# Patient Record
Sex: Male | Born: 1960 | Race: White | Hispanic: No | Marital: Single
Health system: Southern US, Community
[De-identification: ages and names within clinical notes are randomized; demographics above are authoritative.]

---

## 2012-07-06 ENCOUNTER — Emergency Department: Payer: Self-pay | Admitting: Emergency Medicine

## 2012-07-09 ENCOUNTER — Emergency Department: Payer: Self-pay | Admitting: Emergency Medicine

## 2013-01-19 ENCOUNTER — Emergency Department: Payer: Self-pay | Admitting: Emergency Medicine

## 2013-06-18 IMAGING — CR DG SHOULDER 3+V*R*
1 series · 3 of 3 positions shown · non-contrast
Comparison: none

REASON FOR EXAM: swelling , pain, numbness post reduction
COMMENTS:

[Series 1: internal rotate · 0.17mm/px · 3 of 3 slices shown]
[im 1/3]
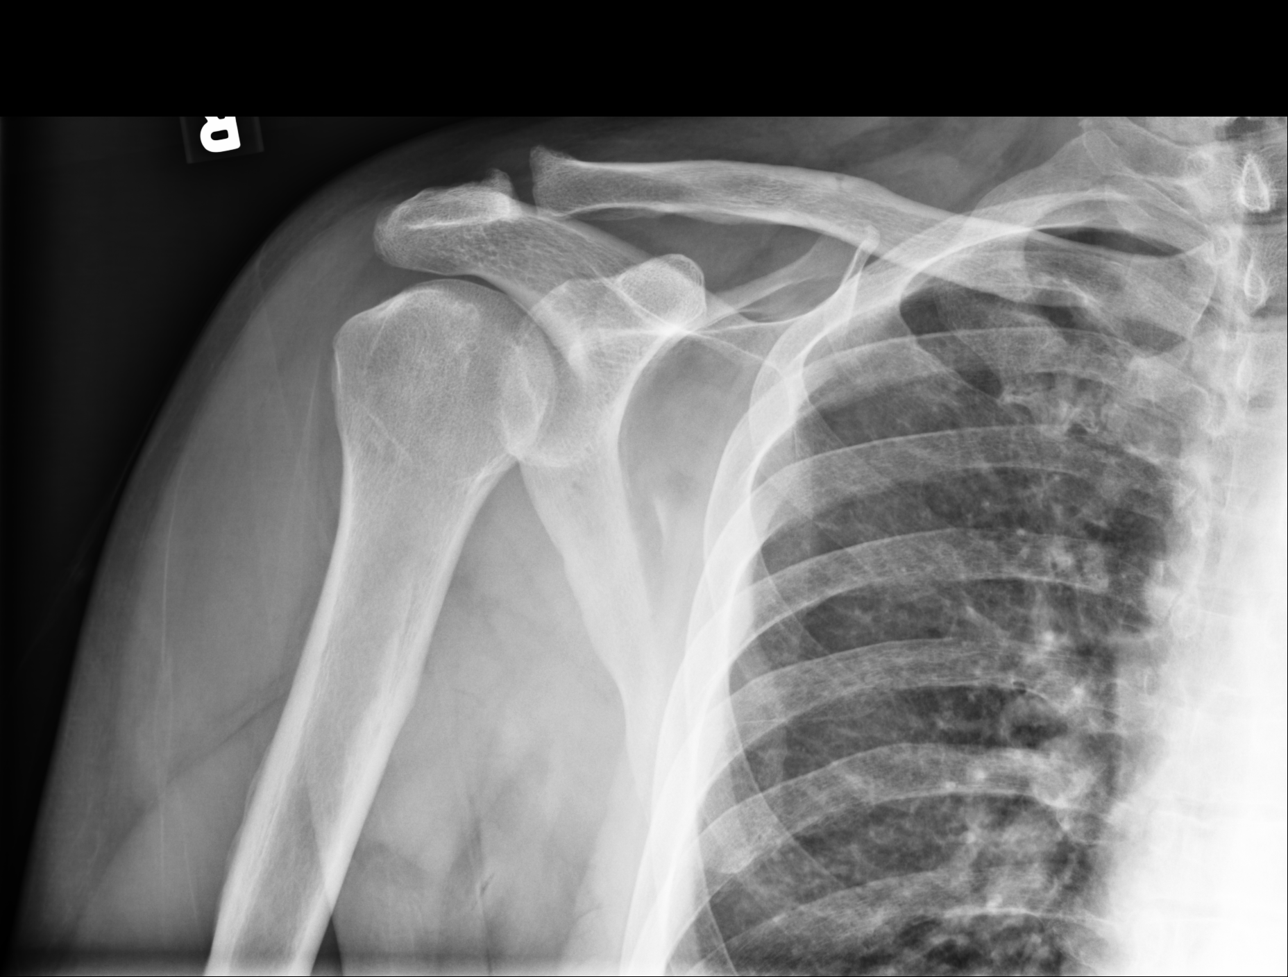
[im 2/3]
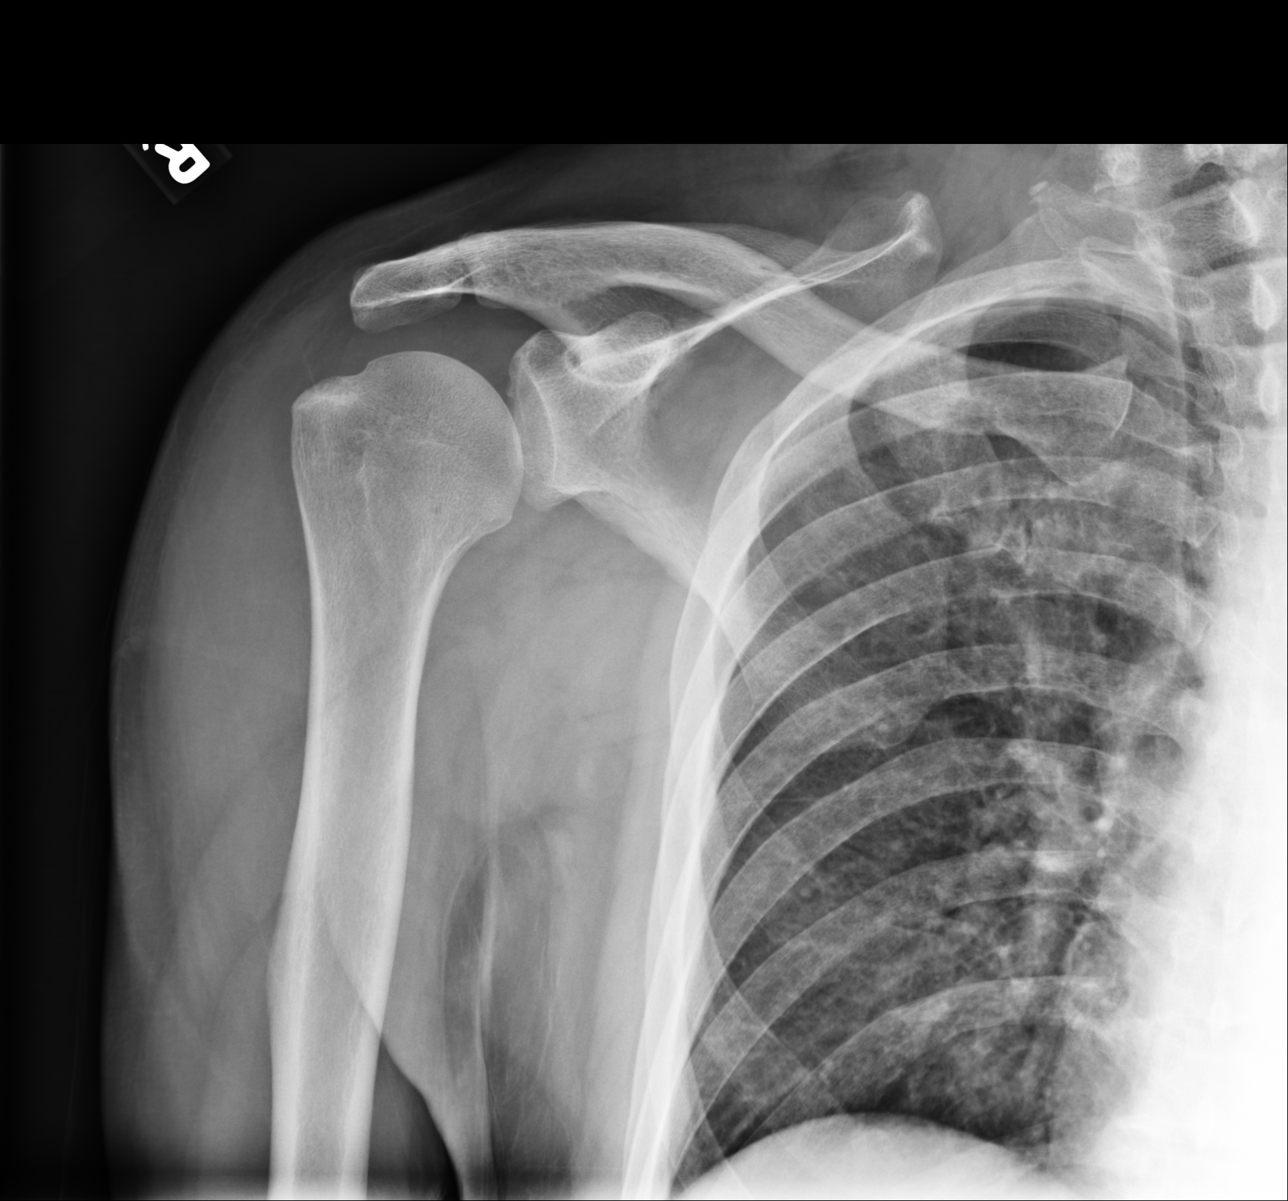
[im 3/3]
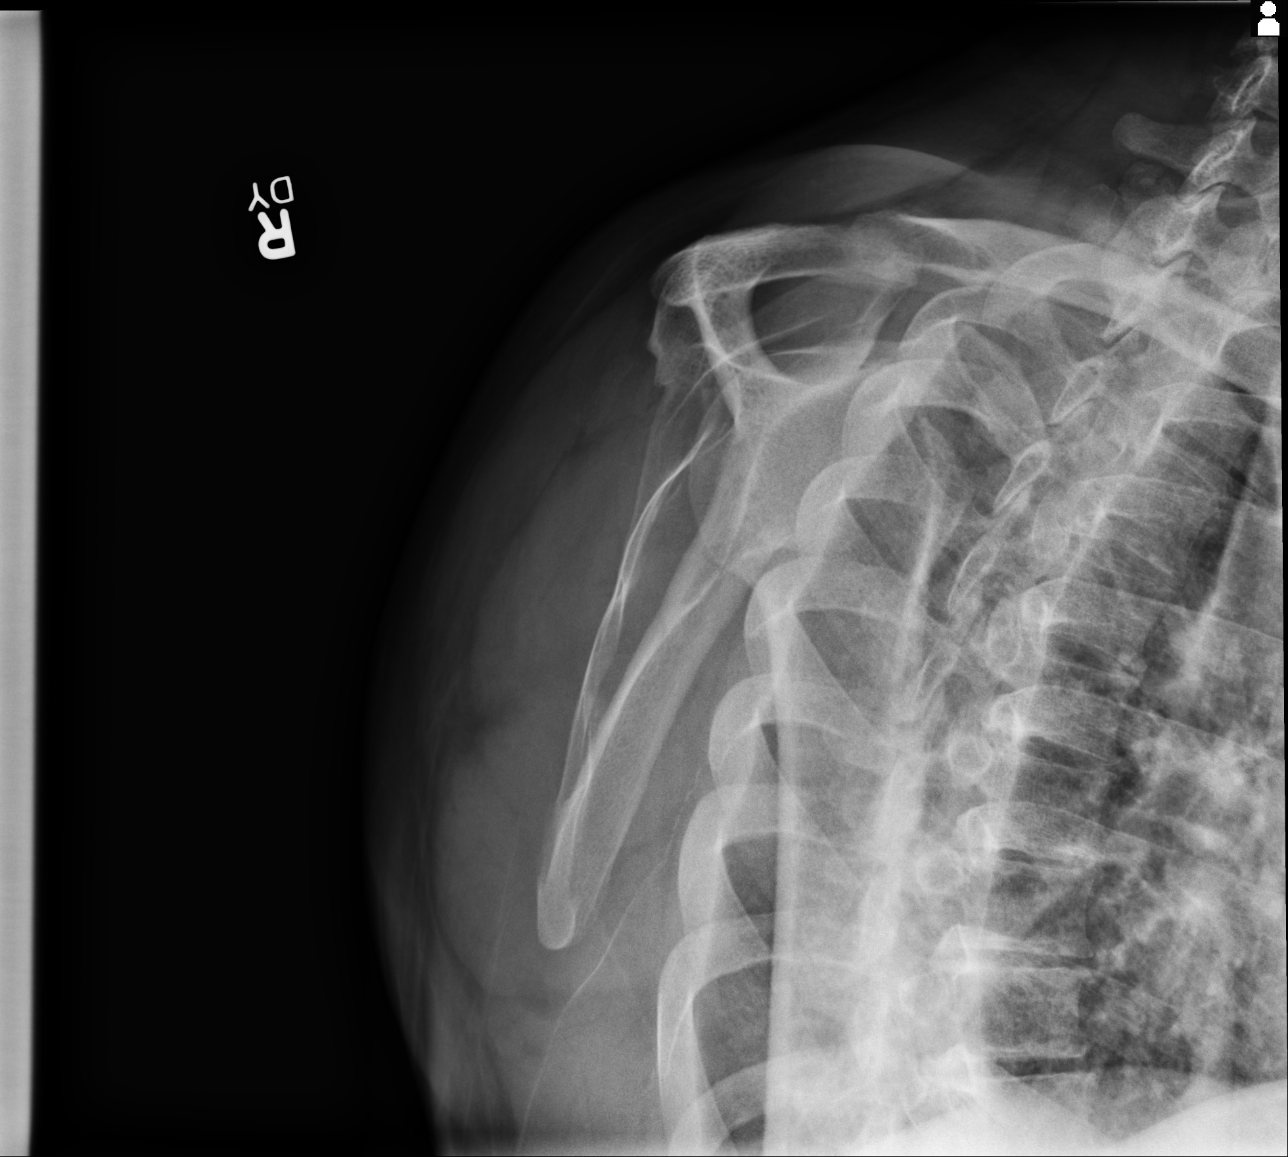

[3 of 3 positions shown; findings below may reference images not displayed]

PROCEDURE:     DXR - DXR SHOULDER RIGHT COMPLETE  - July 09, 2012  [DATE]

RESULT:     Comparison is made to a study July 06, 2012.

The patient has sustained a previous dislocation which appear to be reduced
on postreduction films 06 July, 2012. On today's study the humerus
appears intact as does the bony glenoid. The AC joint appears normally
positioned but there are degenerative changes present. The clavicle is
intact where visualized.
IMPRESSION: I do not see definite evidence of an acute fracture of the
right shoulder and no residual dislocation is demonstrated. However, given
the patient's persistent symptoms, followup MRI is recommended.

[REDACTED]

## 2019-01-02 DIAGNOSIS — M19011 Primary osteoarthritis, right shoulder: Secondary | ICD-10-CM | POA: Diagnosis not present

## 2019-01-02 DIAGNOSIS — M19012 Primary osteoarthritis, left shoulder: Secondary | ICD-10-CM | POA: Diagnosis not present

## 2019-03-26 DIAGNOSIS — Z23 Encounter for immunization: Secondary | ICD-10-CM | POA: Diagnosis not present

## 2019-04-05 DIAGNOSIS — M19019 Primary osteoarthritis, unspecified shoulder: Secondary | ICD-10-CM | POA: Diagnosis not present

## 2019-07-12 DIAGNOSIS — M19019 Primary osteoarthritis, unspecified shoulder: Secondary | ICD-10-CM | POA: Diagnosis not present

## 2019-10-15 DIAGNOSIS — M19011 Primary osteoarthritis, right shoulder: Secondary | ICD-10-CM | POA: Diagnosis not present

## 2019-10-15 DIAGNOSIS — M19012 Primary osteoarthritis, left shoulder: Secondary | ICD-10-CM | POA: Diagnosis not present

## 2019-11-02 DIAGNOSIS — Z23 Encounter for immunization: Secondary | ICD-10-CM | POA: Diagnosis not present

## 2019-11-05 DIAGNOSIS — K219 Gastro-esophageal reflux disease without esophagitis: Secondary | ICD-10-CM | POA: Diagnosis not present

## 2019-11-05 DIAGNOSIS — I10 Essential (primary) hypertension: Secondary | ICD-10-CM | POA: Diagnosis not present

## 2019-11-05 DIAGNOSIS — F418 Other specified anxiety disorders: Secondary | ICD-10-CM | POA: Diagnosis not present

## 2020-01-03 DIAGNOSIS — Z419 Encounter for procedure for purposes other than remedying health state, unspecified: Secondary | ICD-10-CM | POA: Diagnosis not present

## 2020-01-15 DIAGNOSIS — M19012 Primary osteoarthritis, left shoulder: Secondary | ICD-10-CM | POA: Diagnosis not present

## 2020-01-15 DIAGNOSIS — M19011 Primary osteoarthritis, right shoulder: Secondary | ICD-10-CM | POA: Diagnosis not present

## 2020-02-03 DIAGNOSIS — Z419 Encounter for procedure for purposes other than remedying health state, unspecified: Secondary | ICD-10-CM | POA: Diagnosis not present

## 2020-02-14 DIAGNOSIS — M19012 Primary osteoarthritis, left shoulder: Secondary | ICD-10-CM | POA: Diagnosis not present

## 2020-02-14 DIAGNOSIS — M19011 Primary osteoarthritis, right shoulder: Secondary | ICD-10-CM | POA: Diagnosis not present

## 2020-03-04 DIAGNOSIS — M659 Synovitis and tenosynovitis, unspecified: Secondary | ICD-10-CM | POA: Diagnosis not present

## 2020-03-04 DIAGNOSIS — M19011 Primary osteoarthritis, right shoulder: Secondary | ICD-10-CM | POA: Diagnosis not present

## 2020-03-04 DIAGNOSIS — M19012 Primary osteoarthritis, left shoulder: Secondary | ICD-10-CM | POA: Diagnosis not present

## 2020-03-04 DIAGNOSIS — M75122 Complete rotator cuff tear or rupture of left shoulder, not specified as traumatic: Secondary | ICD-10-CM | POA: Diagnosis not present

## 2020-03-05 DIAGNOSIS — Z419 Encounter for procedure for purposes other than remedying health state, unspecified: Secondary | ICD-10-CM | POA: Diagnosis not present

## 2020-03-06 DIAGNOSIS — M19011 Primary osteoarthritis, right shoulder: Secondary | ICD-10-CM | POA: Diagnosis not present

## 2020-03-06 DIAGNOSIS — M19012 Primary osteoarthritis, left shoulder: Secondary | ICD-10-CM | POA: Diagnosis not present

## 2020-03-31 DIAGNOSIS — M75122 Complete rotator cuff tear or rupture of left shoulder, not specified as traumatic: Secondary | ICD-10-CM | POA: Diagnosis not present

## 2020-04-04 DIAGNOSIS — Z419 Encounter for procedure for purposes other than remedying health state, unspecified: Secondary | ICD-10-CM | POA: Diagnosis not present

## 2020-04-11 DIAGNOSIS — M19019 Primary osteoarthritis, unspecified shoulder: Secondary | ICD-10-CM | POA: Diagnosis not present

## 2020-04-11 DIAGNOSIS — M75122 Complete rotator cuff tear or rupture of left shoulder, not specified as traumatic: Secondary | ICD-10-CM | POA: Diagnosis not present

## 2020-05-01 DIAGNOSIS — M19011 Primary osteoarthritis, right shoulder: Secondary | ICD-10-CM | POA: Diagnosis not present

## 2020-05-01 DIAGNOSIS — Z720 Tobacco use: Secondary | ICD-10-CM | POA: Diagnosis not present

## 2020-05-01 DIAGNOSIS — I10 Essential (primary) hypertension: Secondary | ICD-10-CM | POA: Diagnosis not present

## 2020-05-01 DIAGNOSIS — Z9189 Other specified personal risk factors, not elsewhere classified: Secondary | ICD-10-CM | POA: Diagnosis not present

## 2020-05-01 DIAGNOSIS — Z01818 Encounter for other preprocedural examination: Secondary | ICD-10-CM | POA: Diagnosis not present

## 2020-05-01 DIAGNOSIS — F1290 Cannabis use, unspecified, uncomplicated: Secondary | ICD-10-CM | POA: Diagnosis not present

## 2020-05-01 DIAGNOSIS — M75122 Complete rotator cuff tear or rupture of left shoulder, not specified as traumatic: Secondary | ICD-10-CM | POA: Diagnosis not present

## 2020-05-01 DIAGNOSIS — E785 Hyperlipidemia, unspecified: Secondary | ICD-10-CM | POA: Diagnosis not present

## 2020-05-01 DIAGNOSIS — M19012 Primary osteoarthritis, left shoulder: Secondary | ICD-10-CM | POA: Diagnosis not present

## 2020-05-05 DIAGNOSIS — Z419 Encounter for procedure for purposes other than remedying health state, unspecified: Secondary | ICD-10-CM | POA: Diagnosis not present

## 2020-05-13 DIAGNOSIS — H9193 Unspecified hearing loss, bilateral: Secondary | ICD-10-CM | POA: Diagnosis not present

## 2020-05-13 DIAGNOSIS — I10 Essential (primary) hypertension: Secondary | ICD-10-CM | POA: Diagnosis not present

## 2020-05-13 DIAGNOSIS — F17210 Nicotine dependence, cigarettes, uncomplicated: Secondary | ICD-10-CM | POA: Diagnosis not present

## 2020-05-13 DIAGNOSIS — M5412 Radiculopathy, cervical region: Secondary | ICD-10-CM | POA: Diagnosis not present

## 2020-05-13 DIAGNOSIS — Z1211 Encounter for screening for malignant neoplasm of colon: Secondary | ICD-10-CM | POA: Diagnosis not present

## 2020-05-13 DIAGNOSIS — E663 Overweight: Secondary | ICD-10-CM | POA: Diagnosis not present

## 2020-05-13 DIAGNOSIS — K635 Polyp of colon: Secondary | ICD-10-CM | POA: Diagnosis not present

## 2020-05-13 DIAGNOSIS — Z0000 Encounter for general adult medical examination without abnormal findings: Secondary | ICD-10-CM | POA: Diagnosis not present

## 2020-05-13 DIAGNOSIS — E785 Hyperlipidemia, unspecified: Secondary | ICD-10-CM | POA: Diagnosis not present

## 2020-05-13 DIAGNOSIS — F418 Other specified anxiety disorders: Secondary | ICD-10-CM | POA: Diagnosis not present

## 2020-05-13 DIAGNOSIS — Z23 Encounter for immunization: Secondary | ICD-10-CM | POA: Diagnosis not present

## 2020-05-13 DIAGNOSIS — M19019 Primary osteoarthritis, unspecified shoulder: Secondary | ICD-10-CM | POA: Diagnosis not present

## 2020-05-19 DIAGNOSIS — Z79899 Other long term (current) drug therapy: Secondary | ICD-10-CM | POA: Diagnosis not present

## 2020-05-19 DIAGNOSIS — Z7982 Long term (current) use of aspirin: Secondary | ICD-10-CM | POA: Diagnosis not present

## 2020-05-19 DIAGNOSIS — M7522 Bicipital tendinitis, left shoulder: Secondary | ICD-10-CM | POA: Diagnosis not present

## 2020-05-19 DIAGNOSIS — K219 Gastro-esophageal reflux disease without esophagitis: Secondary | ICD-10-CM | POA: Diagnosis not present

## 2020-05-19 DIAGNOSIS — E785 Hyperlipidemia, unspecified: Secondary | ICD-10-CM | POA: Diagnosis not present

## 2020-05-19 DIAGNOSIS — M25712 Osteophyte, left shoulder: Secondary | ICD-10-CM | POA: Diagnosis not present

## 2020-05-19 DIAGNOSIS — I10 Essential (primary) hypertension: Secondary | ICD-10-CM | POA: Diagnosis not present

## 2020-05-19 DIAGNOSIS — E876 Hypokalemia: Secondary | ICD-10-CM | POA: Diagnosis not present

## 2020-05-19 DIAGNOSIS — F17210 Nicotine dependence, cigarettes, uncomplicated: Secondary | ICD-10-CM | POA: Diagnosis not present

## 2020-05-19 DIAGNOSIS — G8918 Other acute postprocedural pain: Secondary | ICD-10-CM | POA: Diagnosis not present

## 2020-05-19 DIAGNOSIS — Z96612 Presence of left artificial shoulder joint: Secondary | ICD-10-CM | POA: Diagnosis not present

## 2020-05-19 DIAGNOSIS — F1290 Cannabis use, unspecified, uncomplicated: Secondary | ICD-10-CM | POA: Diagnosis not present

## 2020-05-19 DIAGNOSIS — Z791 Long term (current) use of non-steroidal anti-inflammatories (NSAID): Secondary | ICD-10-CM | POA: Diagnosis not present

## 2020-05-19 DIAGNOSIS — M75122 Complete rotator cuff tear or rupture of left shoulder, not specified as traumatic: Secondary | ICD-10-CM | POA: Diagnosis not present

## 2020-06-04 DIAGNOSIS — Z419 Encounter for procedure for purposes other than remedying health state, unspecified: Secondary | ICD-10-CM | POA: Diagnosis not present

## 2020-06-04 DIAGNOSIS — Z96612 Presence of left artificial shoulder joint: Secondary | ICD-10-CM | POA: Diagnosis not present

## 2020-06-16 DIAGNOSIS — M6281 Muscle weakness (generalized): Secondary | ICD-10-CM | POA: Diagnosis not present

## 2020-06-16 DIAGNOSIS — M25512 Pain in left shoulder: Secondary | ICD-10-CM | POA: Diagnosis not present

## 2020-06-16 DIAGNOSIS — M19011 Primary osteoarthritis, right shoulder: Secondary | ICD-10-CM | POA: Diagnosis not present

## 2020-06-16 DIAGNOSIS — M75122 Complete rotator cuff tear or rupture of left shoulder, not specified as traumatic: Secondary | ICD-10-CM | POA: Diagnosis not present

## 2020-06-20 DIAGNOSIS — M25512 Pain in left shoulder: Secondary | ICD-10-CM | POA: Diagnosis not present

## 2020-06-20 DIAGNOSIS — M6281 Muscle weakness (generalized): Secondary | ICD-10-CM | POA: Diagnosis not present

## 2020-06-20 DIAGNOSIS — M19011 Primary osteoarthritis, right shoulder: Secondary | ICD-10-CM | POA: Diagnosis not present

## 2020-06-20 DIAGNOSIS — M75122 Complete rotator cuff tear or rupture of left shoulder, not specified as traumatic: Secondary | ICD-10-CM | POA: Diagnosis not present

## 2020-07-05 DIAGNOSIS — Z419 Encounter for procedure for purposes other than remedying health state, unspecified: Secondary | ICD-10-CM | POA: Diagnosis not present

## 2020-07-14 DIAGNOSIS — E871 Hypo-osmolality and hyponatremia: Secondary | ICD-10-CM | POA: Diagnosis not present

## 2020-07-14 DIAGNOSIS — F17210 Nicotine dependence, cigarettes, uncomplicated: Secondary | ICD-10-CM | POA: Diagnosis not present

## 2020-07-14 DIAGNOSIS — I10 Essential (primary) hypertension: Secondary | ICD-10-CM | POA: Diagnosis not present

## 2020-07-18 DIAGNOSIS — Z96612 Presence of left artificial shoulder joint: Secondary | ICD-10-CM | POA: Diagnosis not present

## 2020-07-18 DIAGNOSIS — Z471 Aftercare following joint replacement surgery: Secondary | ICD-10-CM | POA: Diagnosis not present

## 2020-08-05 DIAGNOSIS — Z419 Encounter for procedure for purposes other than remedying health state, unspecified: Secondary | ICD-10-CM | POA: Diagnosis not present

## 2020-09-02 DIAGNOSIS — Z419 Encounter for procedure for purposes other than remedying health state, unspecified: Secondary | ICD-10-CM | POA: Diagnosis not present

## 2020-09-16 DIAGNOSIS — M19011 Primary osteoarthritis, right shoulder: Secondary | ICD-10-CM | POA: Diagnosis not present

## 2020-09-16 DIAGNOSIS — M19012 Primary osteoarthritis, left shoulder: Secondary | ICD-10-CM | POA: Diagnosis not present

## 2020-09-19 ENCOUNTER — Other Ambulatory Visit: Payer: Self-pay

## 2020-09-19 NOTE — Patient Instructions (Signed)
Visit Information  Mr. Ewart was given information about Medicaid Managed Care team care coordination services as a part of their [facility] Medicaid benefit. Sabas Frett Minich did not consent to engagement with the [facility] Managed Care team.   For questions related to your [facility] health plan, please call: [phone]  If you would like to schedule transportation through your [facility] plan, please call the following number at least 2 days in advance of your appointment: [phone]   Call the [facility] Crisis Line at [phone], at any time, 24 hours a day, 7 days a week. If you are in danger or need immediate medical attention call 911.  Mr. Seltzer - following are the goals we discussed in your visit today:  Goals Addressed   None      The patient has been provided with contact information for the Managed Medicaid care management team and has been advised to call with any health related questions or concerns.   Gus Puma, BSW, [facility]  [place]  High Risk Managed Medicaid Team    Following is a copy of your plan of care:  There are no care plans to display for this patient.

## 2020-09-19 NOTE — Patient Outreach (Signed)
  Medicaid Managed Care Social Work Note  09/19/2020 Name:  ELIC VENCILL MRN:  [id number] DOB:  Feb 02, 1961  Jamoni Hewes Sutley is an 60 y.o. year old male who is a primary patient of No primary care provider on file..  The Medicaid Managed Care Coordination team was consulted for assistance with:  Food assistance  Mr. Sonn was given information about Medicaid Managed CareCoordination services today. Greggory Keen Masterson agreed to services and verbal consent obtained.  Engaged with patient  for by telephone forinitial visit in response to referral for case management and/or care coordination services.   Assessments/Interventions:  Review of past medical history, allergies, medications, health status, including review of consultants reports, laboratory and other test data, was performed as part of comprehensive evaluation and provision of chronic care management services.  SDOH: (Social Determinant of Health) assessments and interventions performed:  Patient declines needing assistance with food. Patient declined services.   Advanced Directives Status:  Not addressed in this encounter.  Care Plan                 Not on File  Medications Reviewed Today   Medications were not reviewed prior to this encounter     There are no problems to display for this patient.   Conditions to be addressed/monitored per PCP order:  food  There are no care plans that you recently modified to display for this patient.   Follow up:  Patient requests no follow-up at this time.  Plan: The patient has been provided with contact information for the Managed Medicaid care management team and has been advised to call with any health related questions or concerns.   Gus Puma, BSW, [facility]  [place]  High Risk Managed Medicaid Team

## 2020-10-03 DIAGNOSIS — Z419 Encounter for procedure for purposes other than remedying health state, unspecified: Secondary | ICD-10-CM | POA: Diagnosis not present

## 2020-10-16 DIAGNOSIS — Z96612 Presence of left artificial shoulder joint: Secondary | ICD-10-CM | POA: Diagnosis not present

## 2020-10-16 DIAGNOSIS — M75122 Complete rotator cuff tear or rupture of left shoulder, not specified as traumatic: Secondary | ICD-10-CM | POA: Diagnosis not present

## 2020-10-16 DIAGNOSIS — Z96619 Presence of unspecified artificial shoulder joint: Secondary | ICD-10-CM | POA: Diagnosis not present

## 2020-10-16 DIAGNOSIS — G8929 Other chronic pain: Secondary | ICD-10-CM | POA: Diagnosis not present

## 2020-10-16 DIAGNOSIS — M7989 Other specified soft tissue disorders: Secondary | ICD-10-CM | POA: Diagnosis not present

## 2020-10-16 DIAGNOSIS — M25512 Pain in left shoulder: Secondary | ICD-10-CM | POA: Diagnosis not present

## 2020-11-02 DIAGNOSIS — Z419 Encounter for procedure for purposes other than remedying health state, unspecified: Secondary | ICD-10-CM | POA: Diagnosis not present

## 2020-12-03 DIAGNOSIS — Z419 Encounter for procedure for purposes other than remedying health state, unspecified: Secondary | ICD-10-CM | POA: Diagnosis not present

## 2020-12-16 DIAGNOSIS — F418 Other specified anxiety disorders: Secondary | ICD-10-CM | POA: Diagnosis not present

## 2020-12-16 DIAGNOSIS — I10 Essential (primary) hypertension: Secondary | ICD-10-CM | POA: Diagnosis not present

## 2020-12-16 DIAGNOSIS — S46119A Strain of muscle, fascia and tendon of long head of biceps, unspecified arm, initial encounter: Secondary | ICD-10-CM | POA: Diagnosis not present

## 2020-12-16 DIAGNOSIS — Z1211 Encounter for screening for malignant neoplasm of colon: Secondary | ICD-10-CM | POA: Diagnosis not present

## 2020-12-16 DIAGNOSIS — M5412 Radiculopathy, cervical region: Secondary | ICD-10-CM | POA: Diagnosis not present

## 2020-12-16 DIAGNOSIS — K219 Gastro-esophageal reflux disease without esophagitis: Secondary | ICD-10-CM | POA: Diagnosis not present

## 2020-12-16 DIAGNOSIS — F17210 Nicotine dependence, cigarettes, uncomplicated: Secondary | ICD-10-CM | POA: Diagnosis not present

## 2020-12-16 DIAGNOSIS — M19019 Primary osteoarthritis, unspecified shoulder: Secondary | ICD-10-CM | POA: Diagnosis not present

## 2020-12-16 DIAGNOSIS — E785 Hyperlipidemia, unspecified: Secondary | ICD-10-CM | POA: Diagnosis not present

## 2020-12-16 DIAGNOSIS — Z23 Encounter for immunization: Secondary | ICD-10-CM | POA: Diagnosis not present

## 2020-12-16 DIAGNOSIS — G5603 Carpal tunnel syndrome, bilateral upper limbs: Secondary | ICD-10-CM | POA: Diagnosis not present

## 2020-12-19 DIAGNOSIS — M19011 Primary osteoarthritis, right shoulder: Secondary | ICD-10-CM | POA: Diagnosis not present

## 2021-01-02 DIAGNOSIS — Z419 Encounter for procedure for purposes other than remedying health state, unspecified: Secondary | ICD-10-CM | POA: Diagnosis not present

## 2021-01-16 DIAGNOSIS — M19011 Primary osteoarthritis, right shoulder: Secondary | ICD-10-CM | POA: Diagnosis not present

## 2021-01-16 DIAGNOSIS — M25512 Pain in left shoulder: Secondary | ICD-10-CM | POA: Diagnosis not present

## 2021-01-16 DIAGNOSIS — M19012 Primary osteoarthritis, left shoulder: Secondary | ICD-10-CM | POA: Diagnosis not present

## 2021-01-20 DIAGNOSIS — M19019 Primary osteoarthritis, unspecified shoulder: Secondary | ICD-10-CM | POA: Diagnosis not present

## 2021-01-20 DIAGNOSIS — Z96612 Presence of left artificial shoulder joint: Secondary | ICD-10-CM | POA: Diagnosis not present

## 2021-02-02 DIAGNOSIS — Z419 Encounter for procedure for purposes other than remedying health state, unspecified: Secondary | ICD-10-CM | POA: Diagnosis not present

## 2021-02-20 DIAGNOSIS — E785 Hyperlipidemia, unspecified: Secondary | ICD-10-CM | POA: Diagnosis not present

## 2021-02-20 DIAGNOSIS — F322 Major depressive disorder, single episode, severe without psychotic features: Secondary | ICD-10-CM | POA: Diagnosis not present

## 2021-02-20 DIAGNOSIS — I10 Essential (primary) hypertension: Secondary | ICD-10-CM | POA: Diagnosis not present

## 2021-02-20 DIAGNOSIS — M5412 Radiculopathy, cervical region: Secondary | ICD-10-CM | POA: Diagnosis not present

## 2021-02-20 DIAGNOSIS — F17210 Nicotine dependence, cigarettes, uncomplicated: Secondary | ICD-10-CM | POA: Diagnosis not present

## 2021-02-20 DIAGNOSIS — M19019 Primary osteoarthritis, unspecified shoulder: Secondary | ICD-10-CM | POA: Diagnosis not present

## 2021-02-20 DIAGNOSIS — F411 Generalized anxiety disorder: Secondary | ICD-10-CM | POA: Diagnosis not present

## 2021-02-20 DIAGNOSIS — F418 Other specified anxiety disorders: Secondary | ICD-10-CM | POA: Diagnosis not present

## 2021-03-05 DIAGNOSIS — Z419 Encounter for procedure for purposes other than remedying health state, unspecified: Secondary | ICD-10-CM | POA: Diagnosis not present

## 2021-04-04 DIAGNOSIS — Z419 Encounter for procedure for purposes other than remedying health state, unspecified: Secondary | ICD-10-CM | POA: Diagnosis not present

## 2021-04-06 DIAGNOSIS — Z96612 Presence of left artificial shoulder joint: Secondary | ICD-10-CM | POA: Diagnosis not present

## 2021-04-06 DIAGNOSIS — Z471 Aftercare following joint replacement surgery: Secondary | ICD-10-CM | POA: Diagnosis not present

## 2021-04-16 DIAGNOSIS — M19011 Primary osteoarthritis, right shoulder: Secondary | ICD-10-CM | POA: Diagnosis not present

## 2021-04-16 DIAGNOSIS — M25412 Effusion, left shoulder: Secondary | ICD-10-CM | POA: Diagnosis not present

## 2021-04-16 DIAGNOSIS — M19012 Primary osteoarthritis, left shoulder: Secondary | ICD-10-CM | POA: Diagnosis not present

## 2021-04-28 DIAGNOSIS — Z471 Aftercare following joint replacement surgery: Secondary | ICD-10-CM | POA: Diagnosis not present

## 2021-04-28 DIAGNOSIS — Z96612 Presence of left artificial shoulder joint: Secondary | ICD-10-CM | POA: Diagnosis not present

## 2021-05-01 DIAGNOSIS — F5101 Primary insomnia: Secondary | ICD-10-CM | POA: Diagnosis not present

## 2021-05-01 DIAGNOSIS — Z9189 Other specified personal risk factors, not elsewhere classified: Secondary | ICD-10-CM | POA: Diagnosis not present

## 2021-05-01 DIAGNOSIS — M5412 Radiculopathy, cervical region: Secondary | ICD-10-CM | POA: Diagnosis not present

## 2021-05-01 DIAGNOSIS — F17210 Nicotine dependence, cigarettes, uncomplicated: Secondary | ICD-10-CM | POA: Diagnosis not present

## 2021-05-01 DIAGNOSIS — E785 Hyperlipidemia, unspecified: Secondary | ICD-10-CM | POA: Diagnosis not present

## 2021-05-01 DIAGNOSIS — I10 Essential (primary) hypertension: Secondary | ICD-10-CM | POA: Diagnosis not present

## 2021-05-01 DIAGNOSIS — F418 Other specified anxiety disorders: Secondary | ICD-10-CM | POA: Diagnosis not present

## 2021-05-01 DIAGNOSIS — M19019 Primary osteoarthritis, unspecified shoulder: Secondary | ICD-10-CM | POA: Diagnosis not present

## 2021-05-01 DIAGNOSIS — D126 Benign neoplasm of colon, unspecified: Secondary | ICD-10-CM | POA: Diagnosis not present

## 2021-05-05 DIAGNOSIS — Z419 Encounter for procedure for purposes other than remedying health state, unspecified: Secondary | ICD-10-CM | POA: Diagnosis not present

## 2021-06-04 DIAGNOSIS — Z419 Encounter for procedure for purposes other than remedying health state, unspecified: Secondary | ICD-10-CM | POA: Diagnosis not present

## 2021-07-05 DIAGNOSIS — Z419 Encounter for procedure for purposes other than remedying health state, unspecified: Secondary | ICD-10-CM | POA: Diagnosis not present

## 2021-08-03 DIAGNOSIS — M19012 Primary osteoarthritis, left shoulder: Secondary | ICD-10-CM | POA: Diagnosis not present

## 2021-08-03 DIAGNOSIS — M19011 Primary osteoarthritis, right shoulder: Secondary | ICD-10-CM | POA: Diagnosis not present

## 2021-08-05 DIAGNOSIS — Z419 Encounter for procedure for purposes other than remedying health state, unspecified: Secondary | ICD-10-CM | POA: Diagnosis not present

## 2021-08-13 DIAGNOSIS — F418 Other specified anxiety disorders: Secondary | ICD-10-CM | POA: Diagnosis not present

## 2021-08-13 DIAGNOSIS — E785 Hyperlipidemia, unspecified: Secondary | ICD-10-CM | POA: Diagnosis not present

## 2021-08-13 DIAGNOSIS — G4700 Insomnia, unspecified: Secondary | ICD-10-CM | POA: Diagnosis not present

## 2021-08-13 DIAGNOSIS — F17211 Nicotine dependence, cigarettes, in remission: Secondary | ICD-10-CM | POA: Diagnosis not present

## 2021-08-13 DIAGNOSIS — I10 Essential (primary) hypertension: Secondary | ICD-10-CM | POA: Diagnosis not present

## 2021-08-13 DIAGNOSIS — M5412 Radiculopathy, cervical region: Secondary | ICD-10-CM | POA: Diagnosis not present

## 2021-09-02 DIAGNOSIS — Z419 Encounter for procedure for purposes other than remedying health state, unspecified: Secondary | ICD-10-CM | POA: Diagnosis not present

## 2021-10-03 DIAGNOSIS — Z419 Encounter for procedure for purposes other than remedying health state, unspecified: Secondary | ICD-10-CM | POA: Diagnosis not present

## 2021-11-02 DIAGNOSIS — Z419 Encounter for procedure for purposes other than remedying health state, unspecified: Secondary | ICD-10-CM | POA: Diagnosis not present

## 2021-11-16 DIAGNOSIS — M19019 Primary osteoarthritis, unspecified shoulder: Secondary | ICD-10-CM | POA: Diagnosis not present

## 2021-11-16 DIAGNOSIS — M25511 Pain in right shoulder: Secondary | ICD-10-CM | POA: Diagnosis not present

## 2021-12-03 DIAGNOSIS — Z419 Encounter for procedure for purposes other than remedying health state, unspecified: Secondary | ICD-10-CM | POA: Diagnosis not present

## 2022-01-02 DIAGNOSIS — Z419 Encounter for procedure for purposes other than remedying health state, unspecified: Secondary | ICD-10-CM | POA: Diagnosis not present

## 2022-02-02 DIAGNOSIS — Z419 Encounter for procedure for purposes other than remedying health state, unspecified: Secondary | ICD-10-CM | POA: Diagnosis not present

## 2022-03-05 DIAGNOSIS — Z419 Encounter for procedure for purposes other than remedying health state, unspecified: Secondary | ICD-10-CM | POA: Diagnosis not present

## 2022-03-15 DIAGNOSIS — E785 Hyperlipidemia, unspecified: Secondary | ICD-10-CM | POA: Diagnosis not present

## 2022-03-15 DIAGNOSIS — J301 Allergic rhinitis due to pollen: Secondary | ICD-10-CM | POA: Diagnosis not present

## 2022-03-15 DIAGNOSIS — D126 Benign neoplasm of colon, unspecified: Secondary | ICD-10-CM | POA: Diagnosis not present

## 2022-03-15 DIAGNOSIS — F418 Other specified anxiety disorders: Secondary | ICD-10-CM | POA: Diagnosis not present

## 2022-03-15 DIAGNOSIS — M5412 Radiculopathy, cervical region: Secondary | ICD-10-CM | POA: Diagnosis not present

## 2022-03-15 DIAGNOSIS — G894 Chronic pain syndrome: Secondary | ICD-10-CM | POA: Diagnosis not present

## 2022-03-15 DIAGNOSIS — R7303 Prediabetes: Secondary | ICD-10-CM | POA: Diagnosis not present

## 2022-03-15 DIAGNOSIS — M19019 Primary osteoarthritis, unspecified shoulder: Secondary | ICD-10-CM | POA: Diagnosis not present

## 2022-03-15 DIAGNOSIS — Z23 Encounter for immunization: Secondary | ICD-10-CM | POA: Diagnosis not present

## 2022-03-15 DIAGNOSIS — K219 Gastro-esophageal reflux disease without esophagitis: Secondary | ICD-10-CM | POA: Diagnosis not present

## 2022-03-15 DIAGNOSIS — I10 Essential (primary) hypertension: Secondary | ICD-10-CM | POA: Diagnosis not present

## 2022-03-16 DIAGNOSIS — M19011 Primary osteoarthritis, right shoulder: Secondary | ICD-10-CM | POA: Diagnosis not present

## 2022-04-04 DIAGNOSIS — Z419 Encounter for procedure for purposes other than remedying health state, unspecified: Secondary | ICD-10-CM | POA: Diagnosis not present

## 2022-05-05 DIAGNOSIS — Z419 Encounter for procedure for purposes other than remedying health state, unspecified: Secondary | ICD-10-CM | POA: Diagnosis not present

## 2022-06-04 DIAGNOSIS — Z419 Encounter for procedure for purposes other than remedying health state, unspecified: Secondary | ICD-10-CM | POA: Diagnosis not present

## 2022-06-25 DIAGNOSIS — M19019 Primary osteoarthritis, unspecified shoulder: Secondary | ICD-10-CM | POA: Diagnosis not present

## 2022-06-25 DIAGNOSIS — I10 Essential (primary) hypertension: Secondary | ICD-10-CM | POA: Diagnosis not present

## 2022-06-25 DIAGNOSIS — F418 Other specified anxiety disorders: Secondary | ICD-10-CM | POA: Diagnosis not present

## 2022-06-25 DIAGNOSIS — E785 Hyperlipidemia, unspecified: Secondary | ICD-10-CM | POA: Diagnosis not present

## 2022-06-25 DIAGNOSIS — D126 Benign neoplasm of colon, unspecified: Secondary | ICD-10-CM | POA: Diagnosis not present

## 2022-06-25 DIAGNOSIS — I952 Hypotension due to drugs: Secondary | ICD-10-CM | POA: Diagnosis not present

## 2022-06-25 DIAGNOSIS — G4700 Insomnia, unspecified: Secondary | ICD-10-CM | POA: Diagnosis not present

## 2022-06-25 DIAGNOSIS — M5412 Radiculopathy, cervical region: Secondary | ICD-10-CM | POA: Diagnosis not present

## 2022-06-25 DIAGNOSIS — F17211 Nicotine dependence, cigarettes, in remission: Secondary | ICD-10-CM | POA: Diagnosis not present

## 2022-06-29 DIAGNOSIS — E785 Hyperlipidemia, unspecified: Secondary | ICD-10-CM | POA: Diagnosis not present

## 2022-06-29 DIAGNOSIS — M19019 Primary osteoarthritis, unspecified shoulder: Secondary | ICD-10-CM | POA: Diagnosis not present

## 2022-06-29 DIAGNOSIS — M5412 Radiculopathy, cervical region: Secondary | ICD-10-CM | POA: Diagnosis not present

## 2022-06-29 DIAGNOSIS — F17210 Nicotine dependence, cigarettes, uncomplicated: Secondary | ICD-10-CM | POA: Diagnosis not present

## 2022-06-29 DIAGNOSIS — I10 Essential (primary) hypertension: Secondary | ICD-10-CM | POA: Diagnosis not present

## 2022-06-29 DIAGNOSIS — D126 Benign neoplasm of colon, unspecified: Secondary | ICD-10-CM | POA: Diagnosis not present

## 2022-06-29 DIAGNOSIS — G4700 Insomnia, unspecified: Secondary | ICD-10-CM | POA: Diagnosis not present

## 2022-06-29 DIAGNOSIS — I959 Hypotension, unspecified: Secondary | ICD-10-CM | POA: Diagnosis not present

## 2022-06-29 DIAGNOSIS — F418 Other specified anxiety disorders: Secondary | ICD-10-CM | POA: Diagnosis not present

## 2022-07-05 DIAGNOSIS — Z419 Encounter for procedure for purposes other than remedying health state, unspecified: Secondary | ICD-10-CM | POA: Diagnosis not present

## 2022-07-06 DIAGNOSIS — M19011 Primary osteoarthritis, right shoulder: Secondary | ICD-10-CM | POA: Diagnosis not present

## 2022-08-05 DIAGNOSIS — Z419 Encounter for procedure for purposes other than remedying health state, unspecified: Secondary | ICD-10-CM | POA: Diagnosis not present

## 2022-09-03 DIAGNOSIS — Z419 Encounter for procedure for purposes other than remedying health state, unspecified: Secondary | ICD-10-CM | POA: Diagnosis not present

## 2022-10-04 DIAGNOSIS — Z419 Encounter for procedure for purposes other than remedying health state, unspecified: Secondary | ICD-10-CM | POA: Diagnosis not present

## 2022-10-05 DIAGNOSIS — M19011 Primary osteoarthritis, right shoulder: Secondary | ICD-10-CM | POA: Diagnosis not present

## 2022-11-03 DIAGNOSIS — Z419 Encounter for procedure for purposes other than remedying health state, unspecified: Secondary | ICD-10-CM | POA: Diagnosis not present

## 2022-12-04 DIAGNOSIS — Z419 Encounter for procedure for purposes other than remedying health state, unspecified: Secondary | ICD-10-CM | POA: Diagnosis not present

## 2022-12-07 DIAGNOSIS — M19011 Primary osteoarthritis, right shoulder: Secondary | ICD-10-CM | POA: Diagnosis not present

## 2023-01-03 DIAGNOSIS — Z419 Encounter for procedure for purposes other than remedying health state, unspecified: Secondary | ICD-10-CM | POA: Diagnosis not present

## 2023-02-03 DIAGNOSIS — Z419 Encounter for procedure for purposes other than remedying health state, unspecified: Secondary | ICD-10-CM | POA: Diagnosis not present

## 2023-02-15 DIAGNOSIS — I10 Essential (primary) hypertension: Secondary | ICD-10-CM | POA: Diagnosis not present

## 2023-02-15 DIAGNOSIS — M5412 Radiculopathy, cervical region: Secondary | ICD-10-CM | POA: Diagnosis not present

## 2023-03-06 DIAGNOSIS — Z419 Encounter for procedure for purposes other than remedying health state, unspecified: Secondary | ICD-10-CM | POA: Diagnosis not present

## 2023-03-29 DIAGNOSIS — M19019 Primary osteoarthritis, unspecified shoulder: Secondary | ICD-10-CM | POA: Diagnosis not present

## 2023-03-29 DIAGNOSIS — H547 Unspecified visual loss: Secondary | ICD-10-CM | POA: Diagnosis not present

## 2023-03-29 DIAGNOSIS — I10 Essential (primary) hypertension: Secondary | ICD-10-CM | POA: Diagnosis not present

## 2023-03-29 DIAGNOSIS — M5412 Radiculopathy, cervical region: Secondary | ICD-10-CM | POA: Diagnosis not present

## 2023-03-29 DIAGNOSIS — N401 Enlarged prostate with lower urinary tract symptoms: Secondary | ICD-10-CM | POA: Diagnosis not present

## 2023-03-29 DIAGNOSIS — Z23 Encounter for immunization: Secondary | ICD-10-CM | POA: Diagnosis not present

## 2023-03-29 DIAGNOSIS — R350 Frequency of micturition: Secondary | ICD-10-CM | POA: Diagnosis not present

## 2023-03-29 DIAGNOSIS — K219 Gastro-esophageal reflux disease without esophagitis: Secondary | ICD-10-CM | POA: Diagnosis not present

## 2023-03-29 DIAGNOSIS — E785 Hyperlipidemia, unspecified: Secondary | ICD-10-CM | POA: Diagnosis not present

## 2023-03-29 DIAGNOSIS — J301 Allergic rhinitis due to pollen: Secondary | ICD-10-CM | POA: Diagnosis not present

## 2023-03-29 DIAGNOSIS — F418 Other specified anxiety disorders: Secondary | ICD-10-CM | POA: Diagnosis not present

## 2023-04-05 DIAGNOSIS — Z419 Encounter for procedure for purposes other than remedying health state, unspecified: Secondary | ICD-10-CM | POA: Diagnosis not present

## 2023-05-06 DIAGNOSIS — Z419 Encounter for procedure for purposes other than remedying health state, unspecified: Secondary | ICD-10-CM | POA: Diagnosis not present

## 2023-05-17 DIAGNOSIS — M5412 Radiculopathy, cervical region: Secondary | ICD-10-CM | POA: Diagnosis not present

## 2023-05-17 DIAGNOSIS — F17211 Nicotine dependence, cigarettes, in remission: Secondary | ICD-10-CM | POA: Diagnosis not present

## 2023-05-17 DIAGNOSIS — E785 Hyperlipidemia, unspecified: Secondary | ICD-10-CM | POA: Diagnosis not present

## 2023-05-17 DIAGNOSIS — M19019 Primary osteoarthritis, unspecified shoulder: Secondary | ICD-10-CM | POA: Diagnosis not present

## 2023-05-17 DIAGNOSIS — G4700 Insomnia, unspecified: Secondary | ICD-10-CM | POA: Diagnosis not present

## 2023-05-17 DIAGNOSIS — M19011 Primary osteoarthritis, right shoulder: Secondary | ICD-10-CM | POA: Diagnosis not present

## 2023-05-17 DIAGNOSIS — I10 Essential (primary) hypertension: Secondary | ICD-10-CM | POA: Diagnosis not present

## 2023-06-05 DIAGNOSIS — Z419 Encounter for procedure for purposes other than remedying health state, unspecified: Secondary | ICD-10-CM | POA: Diagnosis not present

## 2023-07-06 DIAGNOSIS — Z419 Encounter for procedure for purposes other than remedying health state, unspecified: Secondary | ICD-10-CM | POA: Diagnosis not present

## 2023-08-06 DIAGNOSIS — Z419 Encounter for procedure for purposes other than remedying health state, unspecified: Secondary | ICD-10-CM | POA: Diagnosis not present

## 2023-09-03 DIAGNOSIS — Z419 Encounter for procedure for purposes other than remedying health state, unspecified: Secondary | ICD-10-CM | POA: Diagnosis not present

## 2023-09-21 DIAGNOSIS — Z1211 Encounter for screening for malignant neoplasm of colon: Secondary | ICD-10-CM | POA: Diagnosis not present

## 2023-09-21 DIAGNOSIS — F331 Major depressive disorder, recurrent, moderate: Secondary | ICD-10-CM | POA: Diagnosis not present

## 2023-09-21 DIAGNOSIS — M1990 Unspecified osteoarthritis, unspecified site: Secondary | ICD-10-CM | POA: Diagnosis not present

## 2023-09-21 DIAGNOSIS — N400 Enlarged prostate without lower urinary tract symptoms: Secondary | ICD-10-CM | POA: Diagnosis not present

## 2023-09-21 DIAGNOSIS — M5412 Radiculopathy, cervical region: Secondary | ICD-10-CM | POA: Diagnosis not present

## 2023-09-21 DIAGNOSIS — I10 Essential (primary) hypertension: Secondary | ICD-10-CM | POA: Diagnosis not present

## 2023-09-21 DIAGNOSIS — F419 Anxiety disorder, unspecified: Secondary | ICD-10-CM | POA: Diagnosis not present

## 2023-09-23 DIAGNOSIS — M19011 Primary osteoarthritis, right shoulder: Secondary | ICD-10-CM | POA: Diagnosis not present

## 2023-10-15 DIAGNOSIS — Z419 Encounter for procedure for purposes other than remedying health state, unspecified: Secondary | ICD-10-CM | POA: Diagnosis not present

## 2023-10-24 DIAGNOSIS — F418 Other specified anxiety disorders: Secondary | ICD-10-CM | POA: Diagnosis not present

## 2023-10-24 DIAGNOSIS — E785 Hyperlipidemia, unspecified: Secondary | ICD-10-CM | POA: Diagnosis not present

## 2023-10-24 DIAGNOSIS — I10 Essential (primary) hypertension: Secondary | ICD-10-CM | POA: Diagnosis not present

## 2023-10-24 DIAGNOSIS — M5412 Radiculopathy, cervical region: Secondary | ICD-10-CM | POA: Diagnosis not present

## 2023-10-24 DIAGNOSIS — F17211 Nicotine dependence, cigarettes, in remission: Secondary | ICD-10-CM | POA: Diagnosis not present

## 2023-10-24 DIAGNOSIS — G4700 Insomnia, unspecified: Secondary | ICD-10-CM | POA: Diagnosis not present

## 2023-11-14 DIAGNOSIS — Z419 Encounter for procedure for purposes other than remedying health state, unspecified: Secondary | ICD-10-CM | POA: Diagnosis not present

## 2023-12-15 DIAGNOSIS — Z419 Encounter for procedure for purposes other than remedying health state, unspecified: Secondary | ICD-10-CM | POA: Diagnosis not present

## 2024-01-14 DIAGNOSIS — Z419 Encounter for procedure for purposes other than remedying health state, unspecified: Secondary | ICD-10-CM | POA: Diagnosis not present

## 2024-02-01 DIAGNOSIS — F17211 Nicotine dependence, cigarettes, in remission: Secondary | ICD-10-CM | POA: Diagnosis not present

## 2024-02-01 DIAGNOSIS — I10 Essential (primary) hypertension: Secondary | ICD-10-CM | POA: Diagnosis not present

## 2024-02-01 DIAGNOSIS — N401 Enlarged prostate with lower urinary tract symptoms: Secondary | ICD-10-CM | POA: Diagnosis not present

## 2024-02-01 DIAGNOSIS — R350 Frequency of micturition: Secondary | ICD-10-CM | POA: Diagnosis not present

## 2024-02-01 DIAGNOSIS — F418 Other specified anxiety disorders: Secondary | ICD-10-CM | POA: Diagnosis not present

## 2024-02-01 DIAGNOSIS — E785 Hyperlipidemia, unspecified: Secondary | ICD-10-CM | POA: Diagnosis not present

## 2024-02-01 DIAGNOSIS — F331 Major depressive disorder, recurrent, moderate: Secondary | ICD-10-CM | POA: Diagnosis not present

## 2024-02-01 DIAGNOSIS — M5412 Radiculopathy, cervical region: Secondary | ICD-10-CM | POA: Diagnosis not present

## 2024-02-14 DIAGNOSIS — Z419 Encounter for procedure for purposes other than remedying health state, unspecified: Secondary | ICD-10-CM | POA: Diagnosis not present

## 2024-02-23 DIAGNOSIS — M4722 Other spondylosis with radiculopathy, cervical region: Secondary | ICD-10-CM | POA: Diagnosis not present

## 2024-02-23 DIAGNOSIS — M5412 Radiculopathy, cervical region: Secondary | ICD-10-CM | POA: Diagnosis not present

## 2024-03-16 DIAGNOSIS — Z419 Encounter for procedure for purposes other than remedying health state, unspecified: Secondary | ICD-10-CM | POA: Diagnosis not present

## 2024-03-19 DIAGNOSIS — Z122 Encounter for screening for malignant neoplasm of respiratory organs: Secondary | ICD-10-CM | POA: Diagnosis not present

## 2024-03-19 DIAGNOSIS — F17210 Nicotine dependence, cigarettes, uncomplicated: Secondary | ICD-10-CM | POA: Diagnosis not present

## 2024-03-19 DIAGNOSIS — F17211 Nicotine dependence, cigarettes, in remission: Secondary | ICD-10-CM | POA: Diagnosis not present

## 2024-04-20 DIAGNOSIS — M19011 Primary osteoarthritis, right shoulder: Secondary | ICD-10-CM | POA: Diagnosis not present

## 2024-05-03 DIAGNOSIS — R7303 Prediabetes: Secondary | ICD-10-CM | POA: Diagnosis not present

## 2024-05-03 DIAGNOSIS — R350 Frequency of micturition: Secondary | ICD-10-CM | POA: Diagnosis not present

## 2024-05-03 DIAGNOSIS — M5412 Radiculopathy, cervical region: Secondary | ICD-10-CM | POA: Diagnosis not present

## 2024-05-03 DIAGNOSIS — I10 Essential (primary) hypertension: Secondary | ICD-10-CM | POA: Diagnosis not present

## 2024-05-03 DIAGNOSIS — J301 Allergic rhinitis due to pollen: Secondary | ICD-10-CM | POA: Diagnosis not present

## 2024-05-03 DIAGNOSIS — Z23 Encounter for immunization: Secondary | ICD-10-CM | POA: Diagnosis not present

## 2024-05-03 DIAGNOSIS — F418 Other specified anxiety disorders: Secondary | ICD-10-CM | POA: Diagnosis not present

## 2024-05-03 DIAGNOSIS — M19019 Primary osteoarthritis, unspecified shoulder: Secondary | ICD-10-CM | POA: Diagnosis not present

## 2024-05-03 DIAGNOSIS — K219 Gastro-esophageal reflux disease without esophagitis: Secondary | ICD-10-CM | POA: Diagnosis not present

## 2024-05-03 DIAGNOSIS — D126 Benign neoplasm of colon, unspecified: Secondary | ICD-10-CM | POA: Diagnosis not present

## 2024-05-03 DIAGNOSIS — N401 Enlarged prostate with lower urinary tract symptoms: Secondary | ICD-10-CM | POA: Diagnosis not present

## 2024-05-03 DIAGNOSIS — F17211 Nicotine dependence, cigarettes, in remission: Secondary | ICD-10-CM | POA: Diagnosis not present

## 2024-06-15 DIAGNOSIS — Z419 Encounter for procedure for purposes other than remedying health state, unspecified: Secondary | ICD-10-CM | POA: Diagnosis not present
